# Patient Record
Sex: Female | Born: 1955 | Race: Black or African American | Hispanic: No | Smoking: Former smoker
Health system: Southern US, Community
[De-identification: ages and names within clinical notes are randomized; demographics above are authoritative.]

## PROBLEM LIST (undated history)

## (undated) DIAGNOSIS — F319 Bipolar disorder, unspecified: Secondary | ICD-10-CM

## (undated) DIAGNOSIS — I639 Cerebral infarction, unspecified: Secondary | ICD-10-CM

## (undated) DIAGNOSIS — I10 Essential (primary) hypertension: Secondary | ICD-10-CM

## (undated) HISTORY — PX: TUBAL LIGATION: SHX77

## (undated) HISTORY — PX: CAROTID STENT: SHX1301

---

## 2003-06-20 ENCOUNTER — Inpatient Hospital Stay (HOSPITAL_COMMUNITY): Admission: EM | Admit: 2003-06-20 | Discharge: 2003-06-28 | Payer: Self-pay | Admitting: Psychiatry

## 2014-01-31 ENCOUNTER — Emergency Department (HOSPITAL_COMMUNITY)
Admission: EM | Admit: 2014-01-31 | Discharge: 2014-02-01 | Disposition: A | Discharge status: Home / Self Care | Attending: Emergency Medicine | Admitting: Emergency Medicine

## 2014-01-31 ENCOUNTER — Emergency Department (HOSPITAL_COMMUNITY)

## 2014-01-31 ENCOUNTER — Encounter (HOSPITAL_COMMUNITY): Payer: Self-pay | Admitting: Emergency Medicine

## 2014-01-31 DIAGNOSIS — Z87891 Personal history of nicotine dependence: Secondary | ICD-10-CM | POA: Insufficient documentation

## 2014-01-31 DIAGNOSIS — F32A Depression, unspecified: Secondary | ICD-10-CM

## 2014-01-31 DIAGNOSIS — R4189 Other symptoms and signs involving cognitive functions and awareness: Secondary | ICD-10-CM

## 2014-01-31 DIAGNOSIS — R410 Disorientation, unspecified: Secondary | ICD-10-CM

## 2014-01-31 DIAGNOSIS — Z79899 Other long term (current) drug therapy: Secondary | ICD-10-CM | POA: Insufficient documentation

## 2014-01-31 DIAGNOSIS — F39 Unspecified mood [affective] disorder: Secondary | ICD-10-CM

## 2014-01-31 DIAGNOSIS — F329 Major depressive disorder, single episode, unspecified: Secondary | ICD-10-CM

## 2014-01-31 DIAGNOSIS — F319 Bipolar disorder, unspecified: Secondary | ICD-10-CM | POA: Insufficient documentation

## 2014-01-31 DIAGNOSIS — I10 Essential (primary) hypertension: Secondary | ICD-10-CM | POA: Insufficient documentation

## 2014-01-31 DIAGNOSIS — R4182 Altered mental status, unspecified: Secondary | ICD-10-CM | POA: Insufficient documentation

## 2014-01-31 HISTORY — DX: Essential (primary) hypertension: I10

## 2014-01-31 HISTORY — DX: Bipolar disorder, unspecified: F31.9

## 2014-01-31 HISTORY — DX: Cerebral infarction, unspecified: I63.9

## 2014-01-31 LAB — CBC
HEMATOCRIT: 38.1 % (ref 36.0–46.0)
Hemoglobin: 13 g/dL (ref 12.0–15.0)
MCH: 29 pg (ref 26.0–34.0)
MCHC: 34.1 g/dL (ref 30.0–36.0)
MCV: 84.9 fL (ref 78.0–100.0)
Platelets: 250 10*3/uL (ref 150–400)
RBC: 4.49 MIL/uL (ref 3.87–5.11)
RDW: 12.2 % (ref 11.5–15.5)
WBC: 7 10*3/uL (ref 4.0–10.5)

## 2014-01-31 LAB — RAPID URINE DRUG SCREEN, HOSP PERFORMED
Amphetamines: NOT DETECTED
Barbiturates: NOT DETECTED
Benzodiazepines: NOT DETECTED
COCAINE: NOT DETECTED
OPIATES: NOT DETECTED
Tetrahydrocannabinol: NOT DETECTED

## 2014-01-31 LAB — COMPREHENSIVE METABOLIC PANEL
ALBUMIN: 4.3 g/dL (ref 3.5–5.2)
ALT: 11 U/L (ref 0–35)
ANION GAP: 14 (ref 5–15)
AST: 17 U/L (ref 0–37)
Alkaline Phosphatase: 88 U/L (ref 39–117)
BUN: 11 mg/dL (ref 6–23)
CALCIUM: 10 mg/dL (ref 8.4–10.5)
CO2: 24 mEq/L (ref 19–32)
CREATININE: 0.87 mg/dL (ref 0.50–1.10)
Chloride: 104 mEq/L (ref 96–112)
GFR calc Af Amer: 85 mL/min — ABNORMAL LOW (ref >90)
GFR calc non Af Amer: 73 mL/min — ABNORMAL LOW (ref >90)
Glucose, Bld: 92 mg/dL (ref 70–99)
Potassium: 4.2 mEq/L (ref 3.7–5.3)
Sodium: 142 mEq/L (ref 137–147)
TOTAL PROTEIN: 8.8 g/dL — AB (ref 6.0–8.3)
Total Bilirubin: 1.2 mg/dL (ref 0.3–1.2)

## 2014-01-31 LAB — SALICYLATE LEVEL

## 2014-01-31 LAB — ETHANOL: Alcohol, Ethyl (B): <11 mg/dL (ref 0–11)

## 2014-01-31 LAB — ACETAMINOPHEN LEVEL: Acetaminophen (Tylenol), Serum: <15 ug/mL (ref 10–30)

## 2014-01-31 MED ORDER — ONDANSETRON HCL 4 MG PO TABS: 4.0000 mg | ORAL_TABLET | Freq: Three times a day (TID) | ORAL | Status: DC | PRN

## 2014-01-31 MED ORDER — AMLODIPINE BESYLATE 10 MG PO TABS
10.0000 mg | ORAL_TABLET | Freq: Every day | ORAL | Status: DC
  Administered 2014-02-01: 10 mg via ORAL
  Filled 2014-01-31: qty 1

## 2014-01-31 MED ORDER — ACETAMINOPHEN 325 MG PO TABS
650.0000 mg | ORAL_TABLET | ORAL | Status: DC | PRN
  Administered 2014-01-31 – 2014-02-01 (×2): 650 mg via ORAL
  Filled 2014-01-31 (×2): qty 2

## 2014-01-31 MED ORDER — IBUPROFEN 200 MG PO TABS
600.0000 mg | ORAL_TABLET | Freq: Three times a day (TID) | ORAL | Status: DC | PRN
  Filled 2014-01-31: qty 3

## 2014-01-31 MED ORDER — ZOLPIDEM TARTRATE 5 MG PO TABS
5.0000 mg | ORAL_TABLET | Freq: Every evening | ORAL | Status: DC | PRN
  Administered 2014-02-01: 5 mg via ORAL
  Filled 2014-01-31: qty 1

## 2014-01-31 MED ORDER — ALUM & MAG HYDROXIDE-SIMETH 200-200-20 MG/5ML PO SUSP: 30.0000 mL | ORAL | Status: DC | PRN

## 2014-01-31 MED ORDER — METOPROLOL SUCCINATE 12.5 MG HALF TABLET
12.5000 mg | ORAL_TABLET | Freq: Two times a day (BID) | ORAL | Status: DC
  Administered 2014-02-01: 12.5 mg via ORAL
  Filled 2014-01-31 (×2): qty 1

## 2014-01-31 MED ORDER — LISINOPRIL 40 MG PO TABS
40.0000 mg | ORAL_TABLET | Freq: Every day | ORAL | Status: DC
  Administered 2014-02-01: 40 mg via ORAL
  Filled 2014-01-31: qty 1

## 2014-01-31 NOTE — ED Notes (Signed)
Did call [facility] to confirm patients Geodon prescriptions 60mg  capsules; take 3 capsules every evening; last time filled was in April of this year

## 2014-01-31 NOTE — ED Notes (Signed)
TTS at bedside for psych eval

## 2014-01-31 NOTE — ED Notes (Signed)
Daughter at bedside.

## 2014-01-31 NOTE — ED Notes (Signed)
Daughter requests CT scan to rule out stroke or TIA; MD made aware; orders have been placed

## 2014-01-31 NOTE — ED Notes (Addendum)
Pt being moved to room 6; awaiting CT scan; all belongings sent with patient

## 2014-01-31 NOTE — BH Assessment (Signed)
Assessment Note  Erica Kelley is an 58 y.o. female who presents with her daughter for evaluation of declining mental status.  Her daughter reports that her mother had a major stroke in 2004 and that last year they discovered that she had some additional mini strokes, but through her life she has been treated for Bipolar Disorder.  Erica Heumann' daughter recently had her mother evaluated by court order because she is seeking guardianship due to her mother's decreasing ability to care for herself.  She reports she has to remind her mother to take care of her activities of daily living and that her mother frequently slips into states where she cannot function because she seems so fearful and childlike.  Erica Kelley did not participate in the assessment at all, but instead turned to look to her daughter for reassurance.  She barely was able to answer yes, when this writer asked if she lived with her daughter.  However, her daughter reports that this is not how she is most of the time at home.  She reports that her mother had a long relationship with a psychiatrist in [place], but they stopped taking her insurance so she had been going to [facility] in [place] for the last year.  However, her daughter reports everytime she went and told the PA that her mother was not acting normally, the PA increased her Geodon.  She is afraid that the Geodon is causing her mother's decline and reports that when she was evaluated for competency at [facility], the evaluator told her that previous stroke victims should not take Geodon at all.  She is afraid to return to the practice because she does not trust their ability to care for her mother safely.  She states she came to the ED for medicine evaluation and is hoping that she can get her mother's medications straightened out.  THe patient will be referred for Geropsych placement  Axis I: Bipolar, Depressed and Psychotic Disorder NOS Axis II: Deferred Axis III:  Past  Medical History  Diagnosis Date  . Bipolar 1 disorder   . Hypertension   . Stroke   . Manic depression    Axis IV: problems with access to health care services Axis V: 21-30 behavior considerably influenced by delusions or hallucinations OR serious impairment in judgment, communication OR inability to function in almost all areas  Past Medical History:  Past Medical History  Diagnosis Date  . Bipolar 1 disorder   . Hypertension   . Stroke   . Manic depression     Past Surgical History  Procedure Laterality Date  . Carotid stent    . Cesarean section    . Tubal ligation      Family History: History reviewed. No pertinent family history.  Social History:  reports that she has quit smoking. She has never used smokeless tobacco. She reports that she does not drink alcohol or use illicit drugs.  Additional Social History:  Alcohol / Drug Use History of alcohol / drug use?: No history of alcohol / drug abuse  CIWA: CIWA-Ar BP: 125/77 mmHg Pulse Rate: 66 COWS:    Allergies:  Allergies  Allergen Reactions  . Shellfish Allergy Swelling    Home Medications:  (Not in a hospital admission)  OB/GYN Status:  No LMP recorded. Patient is postmenopausal.  General Assessment Data Location of Assessment: [facility] Is this a Tele or Face-to-Face Assessment?: Face-to-Face Is this an Initial Assessment or a Re-assessment for this encounter?: Initial Assessment  Living Arrangements: Children Can pt return to current living arrangement?: Yes Admission Status: Voluntary Is patient capable of signing voluntary admission?: Yes Transfer from: Acute Hospital Referral Source: Self/Family/Friend     [facility] Living Arrangements: Children Name of Psychiatrist: Was seeing regional psychiatrist in [place]  Education Status Is patient currently in school?: Yes  Risk to self Suicidal Ideation: No Suicidal Intent: No Is patient at risk for suicide?: No Suicidal Plan?:  No Access to Means: No Previous Attempts/Gestures: No Intentional Self Injurious Behavior: None Family Suicide History: No Recent stressful life event(s): Recent negative physical changes Persecutory voices/beliefs?: No Depression: Yes Depression Symptoms: Tearfulness Substance abuse history and/or treatment for substance abuse?: No Suicide prevention information given to non-admitted patients: Not applicable  Risk to Others Homicidal Ideation: No Thoughts of Harm to Others: No Current Homicidal Intent: No Current Homicidal Plan: No Access to Homicidal Means: No History of harm to others?: No Assessment of Violence: None Noted Does patient have access to weapons?: No Criminal Charges Pending?: No Does patient have a court date: No  Psychosis Hallucinations: None noted Delusions:  (possible)  Mental Status Report Appear/Hygiene: Disheveled Eye Contact: Poor Motor Activity: Psychomotor retardation;Rigidity Speech: Elective mutism;Slurred;Slow Level of Consciousness: Quiet/awake Mood: Depressed;Helpless Affect: Fearful Anxiety Level: Moderate Thought Processes: Thought Blocking Judgement: Unable to Assess Orientation: Unable to assess Obsessive Compulsive Thoughts/Behaviors: Unable to Assess  Cognitive Functioning Concentration: Unable to Assess Memory: Unable to Assess IQ: Average Insight: Unable to Assess Impulse Control: Unable to Assess Appetite: Fair Sleep: Unable to Assess Vegetative Symptoms: Unable to Assess  ADLScreening [facility] Assessment Services) Patient's cognitive ability adequate to safely complete daily activities?: No Patient able to express need for assistance with ADLs?: No Independently performs ADLs?: No  Prior Inpatient Therapy Prior Inpatient Therapy: Yes Prior Therapy Dates: 2004 [facility] Reason for Treatment: Bipolar  Prior Outpatient Therapy Prior Outpatient Therapy: Yes Prior Therapy Dates:  ongoing Prior Therapy Facilty/Provider(s[place] [facility] [place] Reason for Treatment: Bipolar  ADL Screening (condition at time of admission) Patient's cognitive ability adequate to safely complete daily activities?: No Patient able to express need for assistance with ADLs?: No Independently performs ADLs?: No       Abuse/Neglect Assessment (Assessment to be complete while patient is alone) Physical Abuse: Denies Verbal Abuse: Denies Sexual Abuse: Denies Values / Beliefs Cultural Requests During Hospitalization: None Spiritual Requests During Hospitalization: None     Nutrition Screen- [facility] Adult/[facility] Patient's home diet: Regular  Additional Information 1:1 In Past 12 Months?: No CIRT Risk: No Elopement Risk: No Does patient have medical clearance?: Yes     Disposition:  Disposition Initial Assessment Completed for this Encounter: Yes Disposition of Patient: Inpatient treatment program Type of inpatient treatment program: Adult  On Site Evaluation by:   Reviewed with Physician:    Steward RosDavee Lomax, Milia Warth Marie 01/31/2014 6:15 PM

## 2014-01-31 NOTE — ED Notes (Addendum)
Pt's daughter would like Pt to have a psychiatric evaluation.  Reports that Pt takes Geodon for Bi-Polar disorder.  Pt will not answer questions and daughter sts that Pt is increasingly agitated, anxious, and scared.  Sts these episodes are intermittent.  [facility] and has been seen numerous time for same complaint.  Pt has, also, been [facility].  Hx of stroke.  Pt's daughter is working to become Pt's guardian.

## 2014-01-31 NOTE — ED Provider Notes (Signed)
CSN: [id number]     Arrival date & time 01/31/14  1214 History  This chart was scribed for non-physician practitioner Fayrene Helper, working with Toy Baker, MD by Carl Best, ED Scribe. This patient was seen in room [facility]3/[facility] and the patient's care was started at 1:40 PM.    Chief Complaint  Patient presents with  . Psychiatric Evaluation    No language interpreter was used.   HPI Comments: Erica Kelley is a 58 y.o. female with a history of Bipolar 1 disorder who presents to the Emergency Department for psychiatric evaluation in order to become the patient's guardian.  The patient's daughter states that there has been a steady decline of mental faculty over the past two years.  The patient's daughter states that she had a full psychiatric evaluation last week at [facility] per court order.  The patient's daughter states that she is waiting for the court date on September 10 to give her the evaluation so she can become the patient's guardian.  The patient's daughter states that the patient used to see a psychiatrist that increased her Geodon dosage to 180mg .  She states that the patient no longer sees that psychiatrist because she does not trust them and believes the increase in her medication dosage is responsible for her mental decline.  The patient's daughter states that the patient does not exhibit HI.  She states that the patient seems to be scared to move or say anything.  The patient states that she has noticed a decline in the patient's appetite over the past 2 months.  She states that she has to instruct her to bathe and take her medication.  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     Past Medical History  Diagnosis Date  . Bipolar 1 disorder   . Hypertension   . Stroke   . Manic depression    Past Surgical History  Procedure Laterality Date  . Carotid stent    . Cesarean section    . Tubal ligation     History reviewed. No  pertinent family history. History  Substance Use Topics  . Smoking status: Former Games developer  . Smokeless tobacco: Never Used  . Alcohol Use: No   OB History   Grav Para Term Preterm Abortions TAB SAB Ect Mult Living                 Review of Systems  Unable to perform ROS: Psychiatric disorder      Allergies  Shellfish allergy  Home Medications   Prior to Admission medications   Medication Sig Start Date End Date Taking? Authorizing Provider  acetaminophen (TYLENOL) 500 MG tablet Take 1,000 mg by mouth every 6 (six) hours as needed for moderate pain.   Yes Historical Provider, MD  ALOE VERA PO Take 1 capsule by mouth every Monday, Wednesday, and Friday.   Yes Historical Provider, MD  amLODipine (NORVASC) 10 MG tablet Take 10 mg by mouth daily.   Yes Historical Provider, MD  lisinopril (PRINIVIL,ZESTRIL) 40 MG tablet Take 40 mg by mouth daily.   Yes Historical Provider, MD  metoprolol succinate (TOPROL-XL) 25 MG 24 hr tablet Take 12.5 mg by mouth 2 (two) times daily.   Yes Historical Provider, MD  ziprasidone (GEODON) 60 MG capsule Take 60 mg by mouth at bedtime.   Yes Historical Provider, MD  zolpidem (AMBIEN) 10 MG tablet Take 10 mg by mouth at bedtime.  Yes Historical Provider, MD   Triage Vitals: BP 125/77  Pulse 66  Temp(Src) 98.5 F (36.9 C) (Oral)  Resp 16  SpO2 100%  Physical Exam  Nursing note and vitals reviewed. Constitutional: She is oriented to person, place, and time. She appears well-developed and well-nourished.  HENT:  Head: Normocephalic and atraumatic.  Mouth/Throat: Oropharynx is clear and moist.  Eyes: EOM are normal.  Neck: Normal range of motion.  Cardiovascular: Normal rate, regular rhythm and normal heart sounds.   Pulmonary/Chest: Effort normal.  Musculoskeletal: Normal range of motion.  Neurological: She is alert and oriented to person, place, and time.  Skin: Skin is warm and dry.  Psychiatric: She is noncommunicative.  Following  directions appropriately but does not verbally communicate.  Has good eye contact.    ED Course  Procedures (including critical care time)  DIAGNOSTIC STUDIES: Oxygen Saturation is 100% on room air, normal by my interpretation.    COORDINATION OF CARE: 1:46 PM- Advised the patient that in order to talk to a specialist, it will take a couple of hours.  Discussed the possibility of having TTS come and evaluate the patient.  The patient's daughter agreed  6:26 PM Pt's daughter worries of pt's mental changes, and afraid she is taking too much geodon which was prescribed by her psychiatrist.  Steady mental decline ongoing for the past several months.  Request another psychiastrist to evaluate her mental changes and determine the appropriate medication needed for her.  She is medically cleared, doubt delirium as a cause.  TTS to evaluate pt.  Hx limited as pt is non communicative.    6:49 PM TTS agrees to evaluate pt but request EKG and CXR.    Labs Review Labs Reviewed  COMPREHENSIVE METABOLIC PANEL - Abnormal; Notable for the following:    Total Protein 8.8 (*)    GFR calc non Af Amer 73 (*)    GFR calc Af Amer 85 (*)    All other components within normal limits  SALICYLATE LEVEL - Abnormal; Notable for the following:    Salicylate Lvl <2.0 (*)    All other components within normal limits  ACETAMINOPHEN LEVEL  CBC  ETHANOL  URINE RAPID DRUG SCREEN (HOSP PERFORMED)    Imaging Review No results found.   EKG Interpretation None      MDM   Final diagnoses:  Mood disorder  Depression    BP 125/77  Pulse 66  Temp(Src) 98.5 F (36.9 C) (Oral)  Resp 16  SpO2 100%  I personally performed the services described in this documentation, which was scribed in my presence. The recorded information has been reviewed and is accurate.     Fayrene HelperBowie Xzaria Teo, PA-C 01/31/14 1828  Fayrene HelperBowie Tulip Meharg, PA-C 02/03/14 [id number]

## 2014-01-31 NOTE — ED Notes (Signed)
Bed: WA06 Expected date:  Expected time:  Means of arrival:  Comments: HOLD-TCU

## 2014-01-31 NOTE — ED Provider Notes (Signed)
  Face-to-face evaluation   History: The patient was brought in by her daughter for psychiatric evaluation. It appears that patient's daughter, wants a second opinion. There is a legal proceeding currently regarding the patient's ability to care for self. She has a psychiatric disorder and is currently being treated by a psychiatrist currently. The patient is unable to contribute to history.  Physical exam: She is alert and responsive. She appears fearful. She answers questions with simple answers, such as yes or no. She appears depressed.   Assessment: Bipolar disorder. Patient has been medically stable for evaluation and treatment by the psychiatric service. I anticipate that she will see the psychiatrist, tomorrow morning. She does not require urgent treatment or restraints, at this time. She is voluntary.  Medical screening examination/treatment/procedure(s) were conducted as a shared visit with non-physician practitioner(s) and myself.  I personally evaluated the patient during the encounter  Flint MelterElliott L Claudina Oliphant, MD 01/31/14 2206

## 2014-01-31 NOTE — ED Notes (Signed)
Bed: WA30 Expected date:  Expected time:  Means of arrival:  Comments: Triage 3 

## 2014-01-31 NOTE — BH Assessment (Signed)
[facility] Assessment Progress Note    Pt referred to [facility] and [facility].  Both facilities have beds.

## 2014-02-01 ENCOUNTER — Encounter (HOSPITAL_COMMUNITY): Payer: Self-pay | Admitting: Registered Nurse

## 2014-02-01 DIAGNOSIS — R4189 Other symptoms and signs involving cognitive functions and awareness: Secondary | ICD-10-CM | POA: Diagnosis present

## 2014-02-01 DIAGNOSIS — F29 Unspecified psychosis not due to a substance or known physiological condition: Secondary | ICD-10-CM

## 2014-02-01 DIAGNOSIS — R410 Disorientation, unspecified: Secondary | ICD-10-CM | POA: Diagnosis present

## 2014-02-01 NOTE — BH Assessment (Signed)
[facility] Assessment Progress Note  D/c to [facility]--accepted by Dr. Zonia KiefStephens. Will sign voluntary paperwork and pt will sign in voluntarily and will fax paperwork. Pt should come through admitting department, tell them that she is coming to the geriatric behavioral health unit, and they will come down to get her.   Spoke with pt's dgtr and she will come and pick up pt and transport and sign pt in since she has temporary guardianship.  Shuvon Rankin, NP, agrees with disposition.

## 2014-02-01 NOTE — ED Notes (Signed)
Bedside report received from previous RN, Lynnsey. 

## 2014-02-01 NOTE — Discharge Instructions (Signed)
Altered Mental Status Altered mental status most often refers to an abnormal change in your responsiveness and awareness. It can affect your speech, thought, mobility, memory, attention span, or alertness. It can range from slight confusion to complete unresponsiveness (coma). Altered mental status can be a sign of a serious underlying medical condition. Rapid evaluation and medical treatment is necessary for patients having an altered mental status. CAUSES   Low blood sugar (hypoglycemia) or diabetes.  Severe loss of body fluids (dehydration) or a body salt (electrolyte) imbalance.  A stroke or other neurologic problem, such as dementia or delirium.  A head injury or tumor.  A drug or alcohol overdose.  Exposure to toxins or poisons.  Depression, anxiety, and stress.  A low oxygen level (hypoxia).  An infection.  Blood loss.  Twitching or shaking (seizure).  Heart problems, such as heart attack or heart rhythm problems (arrhythmias).  A body temperature that is too low or too high (hypothermia or hyperthermia). DIAGNOSIS  A diagnosis is based on your history, symptoms, physical and neurologic examinations, and diagnostic tests. Diagnostic tests may include:  Measurement of your blood pressure, pulse, breathing, and oxygen levels (vital signs).  Blood tests.  Urine tests.  X-ray exams.  A computerized magnetic scan (magnetic resonance imaging, MRI).  A computerized X-ray scan (computed tomography, CT scan). TREATMENT  Treatment will depend on the cause. Treatment may include:  Management of an underlying medical or mental health condition.  Critical care or support in the hospital. [place]   Only take over-the-counter or prescription medicines for pain, discomfort, or fever as directed by your caregiver.  Manage underlying conditions as directed by your caregiver.  Eat a healthy, well-balanced diet to maintain strength.  Join a support group or  prevention program to cope with the condition or trauma that caused the altered mental status. Ask your caregiver to help choose a program that works for you.  Follow up with your caregiver for further examination, therapy, or testing as directed. SEEK MEDICAL CARE IF:   You feel unwell or have chills.  You or your family notice a change in your behavior or your alertness.  You have trouble following your caregiver's treatment plan.  You have questions or concerns. SEEK IMMEDIATE MEDICAL CARE IF:   You have a rapid heartbeat or have chest pain.  You have difficulty breathing.  You have a fever.  You have a headache with a stiff neck.  You cough up blood.  You have blood in your urine or stool.  You have severe agitation or confusion. MAKE SURE YOU:   Understand these instructions.  Will watch your condition.  Will get help right away if you are not doing well or get worse. Document Released: 12/25/2009 Document Revised: 09/29/2011 Document Reviewed: 12/25/2009 [facility] Patient Information 2015 [place], [place]. This information is not intended to replace advice given to you by your health care provider. Make sure you discuss any questions you have with your health care provider.  Confusion Confusion is the inability to think with your usual speed or clarity. Confusion may come on quickly or slowly over time. How quickly the confusion comes on depends on the cause. Confusion can be due to any number of causes. CAUSES   Concussion, head injury, or head trauma.  Seizures.  Stroke.  Fever.  Senility.  Heightened emotional states like rage or terror.  Mental illness in which the person loses the ability to determine what is real and what is not (hallucinations).  Infections.  Toxic effects from alcohol, drugs, or prescription medicines.  Dehydration and an imbalance of salts in the body (electrolytes).  Lack of sleep.  Low blood sugar (diabetes).  Low levels  of oxygen (for example from chronic lung disorders).  Drug interactions or other medication side effects.  Nutritional deficiencies, especially niacin, thiamine, vitamin C, or vitamin B.  Sudden drop in body temperature (hypothermia).  Illness in the elderly. Constipation can result in confusion. An elderly person who is hospitalized may become confused due to change in daily routine. SYMPTOMS  People often describe their thinking as cloudy or unclear when they are confused. Confusion can also include feeling disoriented. That means you are unaware of where or who you are. You may also not know what the date or time is. If confused, you may also have difficulty paying attention, remembering and making decisions. Some people also act aggressively when they are confused.  DIAGNOSIS  The medical evaluation of confusion may include:  Blood and urine tests.  X-rays.  Brain and nervous system tests.  Analyzing your brain waves (electroencphalogram or EEG).  A special X-ray (MRI) of your head or other special studies. Your physician will ask questions such as:  Do you get days and nights mixed up?  Are you awake during regular sleep times?  Do you have trouble recognizing people?  Do you know where you are?  Do you know the date and time?  Does the confusion come and go?  Is the confusion quickly getting worse?  Has there been a recent illness?  Has there been a recent head injury?  Are you diabetic?  Do you have a lung disorder?  What medication are you taking?  Have you taken drugs or alcohol? TREATMENT  An admission to the hospital may not be needed, but a confused person should not be left alone. Stay with a family member or friend until the confusion clears. Avoid alcohol, pain relievers or sedative drugs until you have fully recovered. Do not drive until your caregiver says it is okay. HOME CARE INSTRUCTIONS What family and friends can do:  To find out if someone  is confused ask him or her their name, age, and the date. If the person is unsure or answers incorrectly, he or she is confused.  Always introduce yourself, no matter how well the person knows you.  Often remind the person of his or her location.  Place a calendar and clock near the confused person.  Talk about current events and plans for the day.  Try to keep the environment calm, quiet and peaceful.  Make sure the patient keeps follow up appointments with their physician. PREVENTION  Ways to prevent confusion:  Avoid alcohol.  Eat a balanced diet.  Get enough sleep.  Do not become isolated. Spend time with other people and make plans for your days.  Keep careful watch on your blood sugar levels if you are diabetic. SEEK IMMEDIATE MEDICAL CARE IF:   You develop severe headaches, repeated vomiting, seizures, blackouts or slurred speech.  There is increasing confusion, weakness, numbness, restlessness or personality changes.  You develop a loss of balance, have marked dizziness, feel uncoordinated or fall.  You have delusions, hallucinations or develop severe anxiety.  Your family members think you need to be rechecked. Document Released: 08/14/2004 Document Revised: 09/29/2011 Document Reviewed: 04/11/2008 [facility] Patient Information 2015 [place], [place]. This information is not intended to replace advice given to you by your health care provider. Make sure  you discuss any questions you have with your health care provider.

## 2014-02-01 NOTE — ED Notes (Signed)
Pt is ambulatory with steady gait.  Gets OOB every once in a while but easily redirected.

## 2014-02-01 NOTE — ED Notes (Signed)
Patient ate @ 25% of meal

## 2014-02-01 NOTE — ED Notes (Signed)
Pt and belongings wanded by security. Pt has one bag locked in locker #32 in TCU.

## 2014-02-01 NOTE — ED Notes (Signed)
Patient daughter called, states she has temporary guardianship at this time.

## 2014-02-01 NOTE — Consult Note (Signed)
[facility] Face-to-Face Psychiatry Consult   Reason for Consult:  Cognitive changes and confusion Referring Physician:  EDp  Erica Kelley is an 58 y.o. female. Total Time spent with patient: 45 minutes  Assessment: AXIS I:  Psychotic Disorder NOS AXIS II:  Deferred AXIS III:   Past Medical History  Diagnosis Date  . Bipolar 1 disorder   . Hypertension   . Stroke   . Manic depression    AXIS IV:  other psychosocial or environmental problems AXIS V:  21-30 behavior considerably influenced by delusions or hallucinations OR serious impairment in judgment, communication OR inability to function in almost all areas  Plan:  Recommend psychiatric Inpatient admission when medically cleared.  Subjective:   Erica Kelley is a 58 y.o. female patient.  HPI:  Patient brought to [facility] by daughter related to cognitive changes, confusion, decrease in daily activities, and medication management.  During interview patient was able to answer questions clearly.  Took patient sometime to respond (couple seconds) but answered most questions correctly.  Patient states that she has a problem sleeping.  Patient alsostates that she lives with her daughter and her 3 grandsons.  Patient denies suicidal/homicidal ideation, psychosis, and paranoia.  HPI Elements:   Location:  Confusion. Quality:  decrease in ADL's. Severity:  Cognitive changes. Timing:  several weeks.  Past Psychiatric History: Past Medical History  Diagnosis Date  . Bipolar 1 disorder   . Hypertension   . Stroke   . Manic depression     reports that she has quit smoking. She has never used smokeless tobacco. She reports that she does not drink alcohol or use illicit drugs. History reviewed. No pertinent family history. Family History Substance Abuse: No Family Supports: Yes, List: (daughter) Living Arrangements: Children Can pt return to current living arrangement?: Yes Abuse/Neglect [facility]) Physical Abuse: Denies Verbal Abuse:  Denies Sexual Abuse: Denies Allergies:   Allergies  Allergen Reactions  . Shellfish Allergy Swelling    ACT Assessment Complete:  Yes:    Educational Status    Risk to Self: Risk to self Suicidal Ideation: No Suicidal Intent: No Is patient at risk for suicide?: No Suicidal Plan?: No Access to Means: No Previous Attempts/Gestures: No Intentional Self Injurious Behavior: None Family Suicide History: No Recent stressful life event(s): Recent negative physical changes Persecutory voices/beliefs?: No Depression: Yes Depression Symptoms: Tearfulness Substance abuse history and/or treatment for substance abuse?: No Suicide prevention information given to non-admitted patients: Not applicable  Risk to Others: Risk to Others Homicidal Ideation: No Thoughts of Harm to Others: No Current Homicidal Intent: No Current Homicidal Plan: No Access to Homicidal Means: No History of harm to others?: No Assessment of Violence: None Noted Does patient have access to weapons?: No Criminal Charges Pending?: No Does patient have a court date: No  Abuse: Abuse/Neglect Assessment (Assessment to be complete while patient is alone) Physical Abuse: Denies Verbal Abuse: Denies Sexual Abuse: Denies  Prior Inpatient Therapy: Prior Inpatient Therapy Prior Inpatient Therapy: Yes Prior Therapy Dates: 2004 Prior Therapy Facilty/Provider(s): [facility] Reason for Treatment: Bipolar  Prior Outpatient Therapy: Prior Outpatient Therapy Prior Outpatient Therapy: Yes Prior Therapy Dates: ongoing Prior Therapy Facilty/Provider(s): [facility] [place] Reason for Treatment: Bipolar  Additional Information: Additional Information 1:1 In Past 12 Months?: No CIRT Risk: No Elopement Risk: No Does patient have medical clearance?: Yes                  Objective: Blood pressure 135/78, pulse 76, temperature 98.2  F (36.8 C), temperature source Oral, resp. rate 16, SpO2 98.00%.There  is no height or weight on file to calculate BMI. Results for orders placed during the hospital encounter of 01/31/14 (from the past 72 hour(s))  URINE RAPID DRUG SCREEN (HOSP PERFORMED)     Status: None   Collection Time    01/31/14  2:01 PM      Result Value Ref Range   Opiates NONE DETECTED  NONE DETECTED   Cocaine NONE DETECTED  NONE DETECTED   Benzodiazepines NONE DETECTED  NONE DETECTED   Amphetamines NONE DETECTED  NONE DETECTED   Tetrahydrocannabinol NONE DETECTED  NONE DETECTED   Barbiturates NONE DETECTED  NONE DETECTED   Comment:            DRUG SCREEN FOR MEDICAL PURPOSES     ONLY.  IF CONFIRMATION IS NEEDED     FOR ANY PURPOSE, NOTIFY LAB     WITHIN 5 DAYS.                LOWEST DETECTABLE LIMITS     FOR URINE DRUG SCREEN     Drug Class       Cutoff (ng/mL)     Amphetamine      1000     Barbiturate      200     Benzodiazepine   349     Tricyclics       179     Opiates          300     Cocaine          300     THC              50  ACETAMINOPHEN LEVEL     Status: None   Collection Time    01/31/14  2:04 PM      Result Value Ref Range   Acetaminophen (Tylenol), Serum <15.0  10 - 30 ug/mL   Comment:            THERAPEUTIC CONCENTRATIONS VARY     SIGNIFICANTLY. A RANGE OF 10-30     ug/mL MAY BE AN EFFECTIVE     CONCENTRATION FOR MANY PATIENTS.     HOWEVER, SOME ARE BEST TREATED     AT CONCENTRATIONS OUTSIDE THIS     RANGE.     ACETAMINOPHEN CONCENTRATIONS     >150 ug/mL AT 4 HOURS AFTER     INGESTION AND >50 ug/mL AT 12     HOURS AFTER INGESTION ARE     OFTEN ASSOCIATED WITH TOXIC     REACTIONS.  CBC     Status: None   Collection Time    01/31/14  2:04 PM      Result Value Ref Range   WBC 7.0  4.0 - 10.5 K/uL   RBC 4.49  3.87 - 5.11 MIL/uL   Hemoglobin 13.0  12.0 - 15.0 g/dL   HCT 38.1  36.0 - 46.0 %   MCV 84.9  78.0 - 100.0 fL   MCH 29.0  26.0 - 34.0 pg   MCHC 34.1  30.0 - 36.0 g/dL   RDW 12.2  11.5 - 15.5 %   Platelets 250  150 - 400 K/uL   COMPREHENSIVE METABOLIC PANEL     Status: Abnormal   Collection Time    01/31/14  2:04 PM      Result Value Ref Range   Sodium 142  137 - 147 mEq/L   Potassium 4.2  3.7 - 5.3 mEq/L   Chloride 104  96 - 112 mEq/L   CO2 24  19 - 32 mEq/L   Glucose, Bld 92  70 - 99 mg/dL   BUN 11  6 - 23 mg/dL   Creatinine, Ser 0.87  0.50 - 1.10 mg/dL   Calcium 10.0  8.4 - 10.5 mg/dL   Total Protein 8.8 (*) 6.0 - 8.3 g/dL   Albumin 4.3  3.5 - 5.2 g/dL   AST 17  0 - 37 U/L   ALT 11  0 - 35 U/L   Alkaline Phosphatase 88  39 - 117 U/L   Total Bilirubin 1.2  0.3 - 1.2 mg/dL   GFR calc non Af Amer 73 (*) >90 mL/min   GFR calc Af Amer 85 (*) >90 mL/min   Comment: (NOTE)     The eGFR has been calculated using the CKD EPI equation.     This calculation has not been validated in all clinical situations.     eGFR's persistently <90 mL/min signify possible Chronic Kidney     Disease.   Anion gap 14  5 - 15  ETHANOL     Status: None   Collection Time    01/31/14  2:04 PM      Result Value Ref Range   Alcohol, Ethyl (B) <11  0 - 11 mg/dL   Comment:            LOWEST DETECTABLE LIMIT FOR     SERUM ALCOHOL IS 11 mg/dL     FOR MEDICAL PURPOSES ONLY  SALICYLATE LEVEL     Status: Abnormal   Collection Time    01/31/14  2:04 PM      Result Value Ref Range   Salicylate Lvl <7.6 (*) 2.8 - 20.0 mg/dL   Labs are reviewed see above values.  Medications reviewed and no changes made  Current Facility-Administered Medications  Medication Dose Route Frequency Provider Last Rate Last Dose  . acetaminophen (TYLENOL) tablet 650 mg  650 mg Oral Q4H PRN Domenic Moras, PA-C   650 mg at 02/01/14 1402  . alum & mag hydroxide-simeth (MAALOX/MYLANTA) 200-200-20 MG/5ML suspension 30 mL  30 mL Oral PRN Domenic Moras, PA-C      . amLODipine (NORVASC) tablet 10 mg  10 mg Oral Daily Domenic Moras, PA-C   10 mg at 02/01/14 1110  . ibuprofen (ADVIL,MOTRIN) tablet 600 mg  600 mg Oral Q8H PRN Domenic Moras, PA-C      . lisinopril  (PRINIVIL,ZESTRIL) tablet 40 mg  40 mg Oral Daily Domenic Moras, PA-C   40 mg at 02/01/14 1110  . metoprolol succinate (TOPROL-XL) 24 hr tablet 12.5 mg  12.5 mg Oral BID Domenic Moras, PA-C   12.5 mg at 02/01/14 1111  . ondansetron (ZOFRAN) tablet 4 mg  4 mg Oral Q8H PRN Domenic Moras, PA-C      . zolpidem (AMBIEN) tablet 5 mg  5 mg Oral QHS PRN Domenic Moras, PA-C   5 mg at 02/01/14 0205   Current Outpatient Prescriptions  Medication Sig Dispense Refill  . acetaminophen (TYLENOL) 500 MG tablet Take 1,000 mg by mouth every 6 (six) hours as needed for moderate pain.      . ALOE VERA PO Take 1 capsule by mouth every Monday, Wednesday, and Friday.      Marland Kitchen amLODipine (NORVASC) 10 MG tablet Take 10 mg by mouth daily.      Marland Kitchen lisinopril (PRINIVIL,ZESTRIL) 40 MG tablet Take 40 mg by  mouth daily.      . metoprolol succinate (TOPROL-XL) 25 MG 24 hr tablet Take 12.5 mg by mouth 2 (two) times daily.      . ziprasidone (GEODON) 60 MG capsule Take 60 mg by mouth at bedtime.      Marland Kitchen zolpidem (AMBIEN) 10 MG tablet Take 10 mg by mouth at bedtime.        Psychiatric Specialty Exam:     Blood pressure 135/78, pulse 76, temperature 98.2 F (36.8 C), temperature source Oral, resp. rate 16, SpO2 98.00%.There is no height or weight on file to calculate BMI.  General Appearance: Casual  Eye Contact::  Good  Speech:  Clear and Coherent and Slow  Volume:  Normal  Mood:  Depressed  Affect:  Congruent  Thought Process:  Circumstantial  Orientation:  Full (Time, Place, and Person)  Thought Content:  "You'll need to ask my daughter some of those question"  Suicidal Thoughts:  No  Homicidal Thoughts:  No  Memory:  Immediate;   Fair Recent;   Fair Remote;   Fair  Judgement:  Impaired  Insight:  Present  Psychomotor Activity:  Decreased  Concentration:  Fair  Recall:  [place]: Fair  Akathisia:  No  Handed:  Right  AIMS (if indicated):     Assets:  Communication Skills Desire for  Improvement Housing  Sleep:      Musculoskeletal: Strength & Muscle Tone: within normal limits Gait & Station: Did not see patient ambulate.  Patient ableto move extribities Patient leans: N/A  Treatment Plan Summary: Daily contact with patient to assess and evaluate symptoms and progress in treatment Medication management  Inpatient treatment recommended. Patient accepted to [facility] for inpatient treatment.  Monitor for safety and stabilization until transfer.   Earleen Newport, FNP-BC 02/01/2014 4:10 PM  Patient is seen face to face for psychiatric evaluation along with physician extender, case discussed after clinical rounds and formulated appropriate treatment plan. Reviewed the information documented and agree with the treatment plan.  Pama Roskos,JANARDHAHA R. 02/02/2014 9:55 AM

## 2014-02-01 NOTE — ED Notes (Signed)
Patient belongings relocated to locker #[id number]

## 2014-02-05 NOTE — ED Provider Notes (Signed)
Medical screening examination/treatment/procedure(s) were performed by non-physician practitioner and as supervising physician I was immediately available for consultation/collaboration.  Florentina Marquart T Tajah Schreiner, MD 02/05/14 0804 

## 2014-07-18 ENCOUNTER — Emergency Department (HOSPITAL_BASED_OUTPATIENT_CLINIC_OR_DEPARTMENT_OTHER)
Admission: EM | Admit: 2014-07-18 | Discharge: 2014-07-18 | Disposition: A | Discharge status: Home / Self Care | Attending: Emergency Medicine | Admitting: Emergency Medicine

## 2014-07-18 ENCOUNTER — Encounter (HOSPITAL_BASED_OUTPATIENT_CLINIC_OR_DEPARTMENT_OTHER): Payer: Self-pay | Admitting: *Deleted

## 2014-07-18 ENCOUNTER — Emergency Department (HOSPITAL_BASED_OUTPATIENT_CLINIC_OR_DEPARTMENT_OTHER)

## 2014-07-18 DIAGNOSIS — I10 Essential (primary) hypertension: Secondary | ICD-10-CM | POA: Insufficient documentation

## 2014-07-18 DIAGNOSIS — F319 Bipolar disorder, unspecified: Secondary | ICD-10-CM | POA: Insufficient documentation

## 2014-07-18 DIAGNOSIS — Z87891 Personal history of nicotine dependence: Secondary | ICD-10-CM | POA: Diagnosis not present

## 2014-07-18 DIAGNOSIS — K529 Noninfective gastroenteritis and colitis, unspecified: Secondary | ICD-10-CM | POA: Diagnosis not present

## 2014-07-18 DIAGNOSIS — Z79899 Other long term (current) drug therapy: Secondary | ICD-10-CM | POA: Diagnosis not present

## 2014-07-18 DIAGNOSIS — R1013 Epigastric pain: Secondary | ICD-10-CM | POA: Diagnosis present

## 2014-07-18 DIAGNOSIS — Z7982 Long term (current) use of aspirin: Secondary | ICD-10-CM | POA: Diagnosis not present

## 2014-07-18 DIAGNOSIS — Z8673 Personal history of transient ischemic attack (TIA), and cerebral infarction without residual deficits: Secondary | ICD-10-CM | POA: Diagnosis not present

## 2014-07-18 DIAGNOSIS — R42 Dizziness and giddiness: Secondary | ICD-10-CM

## 2014-07-18 LAB — COMPREHENSIVE METABOLIC PANEL
ALBUMIN: 3.8 g/dL (ref 3.5–5.2)
ALK PHOS: 77 U/L (ref 39–117)
ALT: 28 U/L (ref 0–35)
AST: 25 U/L (ref 0–37)
Anion gap: 8 (ref 5–15)
BUN: 22 mg/dL (ref 6–23)
CO2: 24 mmol/L (ref 19–32)
Calcium: 9.2 mg/dL (ref 8.4–10.5)
Chloride: 109 mEq/L (ref 96–112)
Creatinine, Ser: 0.98 mg/dL (ref 0.50–1.10)
GFR calc Af Amer: 73 mL/min — ABNORMAL LOW (ref >90)
GFR calc non Af Amer: 63 mL/min — ABNORMAL LOW (ref >90)
GLUCOSE: 171 mg/dL — AB (ref 70–99)
POTASSIUM: 3.8 mmol/L (ref 3.5–5.1)
Sodium: 141 mmol/L (ref 135–145)
TOTAL PROTEIN: 7.5 g/dL (ref 6.0–8.3)
Total Bilirubin: 1.1 mg/dL (ref 0.3–1.2)

## 2014-07-18 LAB — URINE MICROSCOPIC-ADD ON

## 2014-07-18 LAB — URINALYSIS, ROUTINE W REFLEX MICROSCOPIC
Bilirubin Urine: NEGATIVE
GLUCOSE, UA: NEGATIVE mg/dL
Ketones, ur: NEGATIVE mg/dL
Nitrite: NEGATIVE
Protein, ur: NEGATIVE mg/dL
SPECIFIC GRAVITY, URINE: 1.035 — AB (ref 1.005–1.030)
Urobilinogen, UA: 1 mg/dL (ref 0.0–1.0)
pH: 5.5 (ref 5.0–8.0)

## 2014-07-18 LAB — CBC WITH DIFFERENTIAL/PLATELET
BASOS PCT: 0 % (ref 0–1)
Basophils Absolute: 0 10*3/uL (ref 0.0–0.1)
EOS ABS: 0.1 10*3/uL (ref 0.0–0.7)
Eosinophils Relative: 1 % (ref 0–5)
HCT: 38 % (ref 36.0–46.0)
Hemoglobin: 13 g/dL (ref 12.0–15.0)
LYMPHS ABS: 1.2 10*3/uL (ref 0.7–4.0)
Lymphocytes Relative: 9 % — ABNORMAL LOW (ref 12–46)
MCH: 28.5 pg (ref 26.0–34.0)
MCHC: 34.2 g/dL (ref 30.0–36.0)
MCV: 83.3 fL (ref 78.0–100.0)
Monocytes Absolute: 0.3 10*3/uL (ref 0.1–1.0)
Monocytes Relative: 3 % (ref 3–12)
NEUTROS PCT: 87 % — AB (ref 43–77)
Neutro Abs: 11.5 10*3/uL — ABNORMAL HIGH (ref 1.7–7.7)
PLATELETS: 235 10*3/uL (ref 150–400)
RBC: 4.56 MIL/uL (ref 3.87–5.11)
RDW: 13 % (ref 11.5–15.5)
WBC: 13.2 10*3/uL — ABNORMAL HIGH (ref 4.0–10.5)

## 2014-07-18 LAB — TROPONIN I: Troponin I: 0.03 ng/mL (ref <0.031)

## 2014-07-18 LAB — LIPASE, BLOOD: Lipase: 78 U/L — ABNORMAL HIGH (ref 11–59)

## 2014-07-18 MED ORDER — IOHEXOL 300 MG/ML  SOLN
100.0000 mL | Freq: Once | INTRAMUSCULAR | Status: AC | PRN
  Administered 2014-07-18: 100 mL via INTRAVENOUS

## 2014-07-18 MED ORDER — IOHEXOL 300 MG/ML  SOLN
25.0000 mL | Freq: Once | INTRAMUSCULAR | Status: AC | PRN
  Administered 2014-07-18: 25 mL via ORAL

## 2014-07-18 MED ORDER — SODIUM CHLORIDE 0.9 % IV BOLUS (SEPSIS)
500.0000 mL | Freq: Once | INTRAVENOUS | Status: AC
  Administered 2014-07-18: 500 mL via INTRAVENOUS

## 2014-07-18 MED ORDER — CIPROFLOXACIN HCL 500 MG PO TABS: 500.0000 mg | ORAL_TABLET | Freq: Two times a day (BID) | ORAL | Status: AC

## 2014-07-18 MED ORDER — METRONIDAZOLE 500 MG PO TABS: 500.0000 mg | ORAL_TABLET | Freq: Two times a day (BID) | ORAL | Status: AC

## 2014-07-18 MED ORDER — GI COCKTAIL ~~LOC~~
30.0000 mL | Freq: Once | ORAL | Status: AC
  Administered 2014-07-18: 30 mL via ORAL
  Filled 2014-07-18: qty 30

## 2014-07-18 NOTE — ED Provider Notes (Signed)
CSN: [id number]     Arrival date & time 07/18/14  [id number] History   First MD Initiated Contact with Patient 07/18/14 [phone]     Chief Complaint  Patient presents with  . Abdominal Pain  . Dizziness     (Consider location/radiation/quality/duration/timing/severity/associated sxs/prior Treatment) Patient is a 58 y.o. female presenting with abdominal pain and dizziness. The history is provided by the patient. No language interpreter was used.  Abdominal Pain Pain location:  Epigastric Pain quality: aching   Pain radiates to:  Does not radiate Pain severity:  Moderate Onset quality:  Sudden Duration:  12 hours Timing:  Intermittent Progression:  Waxing and waning Chronicity:  New Relieved by:  Nothing Worsened by:  Nothing tried Ineffective treatments:  None tried Associated symptoms: diarrhea (one episode in department)   Associated symptoms: no chest pain, no chills, no cough, no dysuria, no fatigue, no fever, no hematuria, no nausea, no shortness of breath, no sore throat and no vomiting   Associated symptoms comment:  Dizziness described as room spinning and one episode of near syncope while walking down her hall at home this morning Dizziness Quality:  Room spinning Severity:  Moderate Duration:  12 hours Timing:  Constant Progression:  Unchanged Context comment:  Not changed by mvmt Relieved by:  Nothing Worsened by:  Standing up Ineffective treatments:  None tried Associated symptoms: diarrhea (one episode in department)   Associated symptoms: no blood in stool, no chest pain, no headaches, no nausea, no palpitations, no shortness of breath, no vomiting and no weakness   Risk factors: hx of stroke     Past Medical History  Diagnosis Date  . Bipolar 1 disorder   . Hypertension   . Stroke   . Manic depression    Past Surgical History  Procedure Laterality Date  . Carotid stent    . Cesarean section    . Tubal ligation     No family history on file. History   Substance Use Topics  . Smoking status: Former Games developermoker  . Smokeless tobacco: Never Used  . Alcohol Use: No   OB History    No data available     Review of Systems  Constitutional: Negative for fever, chills, diaphoresis, activity change, appetite change and fatigue.  HENT: Negative for congestion, facial swelling, rhinorrhea and sore throat.   Eyes: Negative for photophobia and discharge.  Respiratory: Negative for cough, chest tightness and shortness of breath.   Cardiovascular: Negative for chest pain, palpitations and leg swelling.  Gastrointestinal: Positive for abdominal pain and diarrhea (one episode in department). Negative for nausea, vomiting and blood in stool.  Endocrine: Negative for polydipsia and polyuria.  Genitourinary: Negative for dysuria, frequency, hematuria, difficulty urinating and pelvic pain.  Musculoskeletal: Negative for back pain, arthralgias, neck pain and neck stiffness.  Skin: Negative for color change and wound.  Allergic/Immunologic: Negative for immunocompromised state.  Neurological: Positive for dizziness. Negative for facial asymmetry, weakness, numbness and headaches.  Hematological: Does not bruise/bleed easily.  Psychiatric/Behavioral: Negative for confusion and agitation.      Allergies  Shellfish allergy  Home Medications   Prior to Admission medications   Medication Sig Start Date End Date Taking? Authorizing Provider  ARIPiprazole (ABILIFY) 15 MG tablet Take 15 mg by mouth daily.   Yes Historical Provider, MD  aspirin 81 MG tablet Take 81 mg by mouth daily.   Yes Historical Provider, MD  atorvastatin (LIPITOR) 20 MG tablet Take 20 mg by mouth daily.   Yes Historical  Provider, MD  Cholecalciferol (VITAMIN D PO) Take by mouth once a week.   Yes Historical Provider, MD  donepezil (ARICEPT) 5 MG tablet Take 5 mg by mouth at bedtime.   Yes Historical Provider, MD  traZODone (DESYREL) 50 MG tablet Take 50 mg by mouth at bedtime as needed  for sleep.   Yes Historical Provider, MD  acetaminophen (TYLENOL) 500 MG tablet Take 1,000 mg by mouth every 6 (six) hours as needed for moderate pain.    Historical Provider, MD  ALOE VERA PO Take 1 capsule by mouth every Monday, Wednesday, and Friday.    Historical Provider, MD  amLODipine (NORVASC) 10 MG tablet Take 10 mg by mouth daily.    Historical Provider, MD  ciprofloxacin (CIPRO) 500 MG tablet Take 1 tablet (500 mg total) by mouth 2 (two) times daily. One po bid x 7 days 07/18/14   Toy Cookey, MD  lisinopril (PRINIVIL,ZESTRIL) 40 MG tablet Take 40 mg by mouth daily.    Historical Provider, MD  metoprolol succinate (TOPROL-XL) 25 MG 24 hr tablet Take 12.5 mg by mouth 2 (two) times daily.    Historical Provider, MD  metroNIDAZOLE (FLAGYL) 500 MG tablet Take 1 tablet (500 mg total) by mouth 2 (two) times daily. One po bid x 7 days 07/18/14   Toy Cookey, MD  ziprasidone (GEODON) 60 MG capsule Take 60 mg by mouth at bedtime.    Historical Provider, MD  zolpidem (AMBIEN) 10 MG tablet Take 10 mg by mouth at bedtime.    Historical Provider, MD   BP 136/85 mmHg  Pulse 68  Temp(Src) 98.7 F (37.1 C) (Oral)  Resp 20  Ht 5\' 3"  (1.6 m)  Wt 175 lb (79.379 kg)  BMI 31.01 kg/m2  SpO2 100% Physical Exam  Constitutional: She is oriented to person, place, and time. She appears well-developed and well-nourished. No distress.  HENT:  Head: Normocephalic and atraumatic.  Mouth/Throat: No oropharyngeal exudate.  Eyes: Pupils are equal, round, and reactive to light.  Neck: Normal range of motion. Neck supple.  Cardiovascular: Normal rate, regular rhythm and normal heart sounds.  Exam reveals no gallop and no friction rub.   No murmur heard. Pulmonary/Chest: Effort normal and breath sounds normal. No respiratory distress. She has no wheezes. She has no rales.  Abdominal: Soft. Bowel sounds are normal. She exhibits no distension and no mass. There is tenderness in the epigastric area. There is  no rebound and no guarding.  Musculoskeletal: Normal range of motion. She exhibits no edema or tenderness.  Neurological: She is alert and oriented to person, place, and time. She has normal strength. She displays no atrophy and no tremor. No cranial nerve deficit or sensory deficit. She exhibits normal muscle tone. Coordination and gait normal. GCS eye subscore is 4. GCS verbal subscore is 5. GCS motor subscore is 6.  Reports dizziness described as room spinning at rest & w/ mvmt that is constant, not worsened by activity. No nystagmus with head mvmt or dix-halpike testing.   Skin: Skin is warm and dry.  Psychiatric: She has a normal mood and affect.    ED Course  Procedures (including critical care time) Labs Review Labs Reviewed  CBC WITH DIFFERENTIAL - Abnormal; Notable for the following:    WBC 13.2 (*)    Neutrophils Relative % 87 (*)    Neutro Abs 11.5 (*)    Lymphocytes Relative 9 (*)    All other components within normal limits  COMPREHENSIVE METABOLIC PANEL -  Abnormal; Notable for the following:    Glucose, Bld 171 (*)    GFR calc non Af Amer 63 (*)    GFR calc Af Amer 73 (*)    All other components within normal limits  LIPASE, BLOOD - Abnormal; Notable for the following:    Lipase 78 (*)    All other components within normal limits  URINALYSIS, ROUTINE W REFLEX MICROSCOPIC - Abnormal; Notable for the following:    APPearance CLOUDY (*)    Specific Gravity, Urine 1.035 (*)    Hgb urine dipstick TRACE (*)    Leukocytes, UA MODERATE (*)    All other components within normal limits  URINE MICROSCOPIC-ADD ON - Abnormal; Notable for the following:    Squamous Epithelial / LPF FEW (*)    Bacteria, UA MANY (*)    All other components within normal limits  URINE CULTURE  TROPONIN I    Imaging Review Ct Head Wo Contrast  07/18/2014   CLINICAL DATA:  Dizziness.  EXAM: CT HEAD WITHOUT CONTRAST  TECHNIQUE: Contiguous axial images were obtained from the base of the skull  through the vertex without intravenous contrast.  COMPARISON:  CT scan of January 31, 2014.  FINDINGS: Bony calvarium appears intact. Mild chronic ischemic white matter disease is noted. Lacunar infarction is noted in left basal ganglia. No mass effect or midline shift is noted. Ventricular size is within normal limits. There is no evidence of mass lesion, hemorrhage or acute infarction.  IMPRESSION: Mild chronic ischemic white matter disease. Old lacunar infarction in left basal ganglia. No acute intracranial abnormality seen.   Electronically Signed   By: Roque Lias M.D.   On: 07/18/2014 08:31   Ct Abdomen Pelvis W Contrast  07/18/2014   CLINICAL DATA:  Epigastric abdominal pain, diarrhea, dizziness again hand last night.  EXAM: CT ABDOMEN AND PELVIS WITH CONTRAST  TECHNIQUE: Multidetector CT imaging of the abdomen and pelvis was performed using the standard protocol following bolus administration of intravenous contrast.  CONTRAST:  OMNIPAQUE IOHEXOL 300 MG/ML  SOLN  COMPARISON:  None.  FINDINGS: Minimal left base atelectasis. Right lung base is clear. Heart is normal size. No effusions.  Liver, gallbladder, spleen, pancreas, adrenals and right kidney are unremarkable. Cortical thinning/ scarring noted in the midpole of the left kidney. No stones or hydronephrosis.  There are abnormal small bowel loops noted in the mid and left abdomen and pelvis with wall thickening. This is compatible with infectious or inflammatory enteritis. No evidence of bowel obstruction. There is a small amount of free fluid in the pelvis and right paracolic gutter. There are scattered air-fluid levels in the colon. The rectosigmoid colon appears fluid-filled. Stomach is decompressed, grossly unremarkable.  Aortic and iliac calcifications without aneurysm. No free air or adenopathy.  No acute bony abnormality. Degenerative disc disease changes at L5-S1 with disc space narrowing and vacuum disc.  IMPRESSION: Small bowel wall  thickening and surrounding inflammation involving small bowel loops in the mid and left lower abdomen and pelvis most compatible with infectious or inflammatory enteritis. Small amount of associated free fluid in the abdomen and pelvis.   Electronically Signed   By: Charlett Nose M.D.   On: 07/18/2014 10:39     EKG Interpretation None      MDM   Final diagnoses:  Dizziness  Epigastric pain  Enteritis    Pt is a 58 y.o. female with Pmhx as above including CVA, who presents with intermittent epigastric abdominal pain since around  2200 last night.  She states episodes last proximal 7 seconds and are occurring every 20 minutes.  She also states that she has had dizziness described as room spinning and had a near syncopal episode while walking down the hall this morning.  Dizziness is constant and unchanged w/ mvmt. She had no loss of consciousness and slid down the wall onto the floor.  Patient had one episode of diarrhea in the department, though denies diarrhea.  Home.  She also denies nausea, vomiting, fevers, chills, dysuria. On PE, VSS, pt in NAD. +epigastric ttp w/o rebound or guarding. No focal neuro findings, reporting constant sensation of room spinning, no nystagmus w/ head mvmt or dix-halpike testing.   1:22 PMdizziness resolved after 1.5 L of normal saline.  Abdominal pain is also resolved after GI cocktail.  She is ambulated in Department without difficulty.  She is tolerated liquids in the department.  CT head with no acute findings. Lipase mildly elevated at 78, however, she has no LFT changes, no right upper quadrant tenderness, and on CT abdomen and pelvis.  Findings suggestive of infectious or inflammatory enteritis.  Patient has had one further episode of diarrhea in the department.  I feel this likely cause of symptoms.  Given symptom resolution with fluids and nonfocal neuro exam.  I doubt stroke.  Patient will be discharged home with course of Cipro/Flagyl.  She's been asked to  follow-up closely with her PCP    Alphonsa GinFrances M Condie evaluation in the Emergency Department is complete. It has been determined that no acute conditions requiring further emergency intervention are present at this time. The patient/guardian have been advised of the diagnosis and plan. We have discussed signs and symptoms that warrant return to the ED, such as changes or worsening in symptoms, worsening pain, fever, inability to tolerate liquids, numbness, weakness, return, or dizziness.      Toy CookeyMegan Docherty, MD 07/18/14 1322

## 2014-07-18 NOTE — ED Notes (Signed)
Ems reports patient has upper abdominal pain and dizziness since last night. This morning while walking down the hall, became dizzy and slid down the wall to the floor.  Spine assessment cleared.  CBG 156.

## 2014-07-18 NOTE — ED Notes (Signed)
Patient states she developed upper abdominal pain and dizziness last night around 2200.  States this morning she has the same symptoms and while walking down the hall, she had to slide down the wall to the floor.  Patient complains of intermittent epigastric abdominal pain and constant dizziness.  Upon arrival to the room, patient stated that she needed to have diarrhea; was incontinent of a small amount.

## 2014-07-18 NOTE — ED Notes (Signed)
Patient ambulated around the department with no complaints of dizziness.

## 2014-07-18 NOTE — Discharge Instructions (Signed)

## 2014-07-19 LAB — URINE CULTURE

## 2016-04-01 IMAGING — CT CT HEAD W/O CM
2 series · 15 of 30 positions shown, 19 images · non-contrast
Comparison: CT of the head February 27, 2013

CLINICAL DATA: Altered mental status, history of stroke.

EXAM:
CT HEAD WITHOUT CONTRAST
TECHNIQUE: Contiguous axial images were obtained from the base of the skull
through the vertex without intravenous contrast.

[Series 2: head w/o · axial · non-contrast · 0.40mm/px · z∈[-172,-47]mm · 13 of 31 slices shown, 17 images]
[im 3/31  brain]
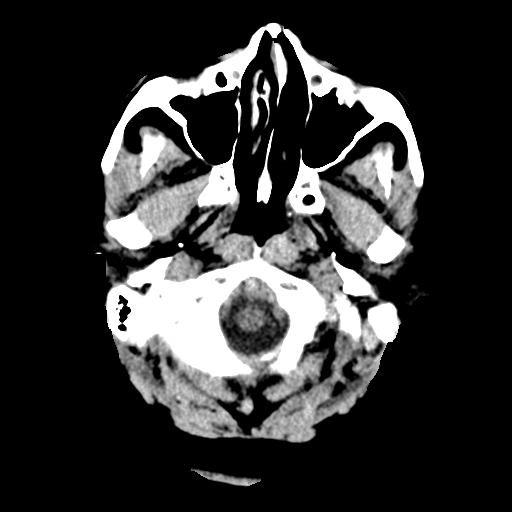
[im 3/31  bone]
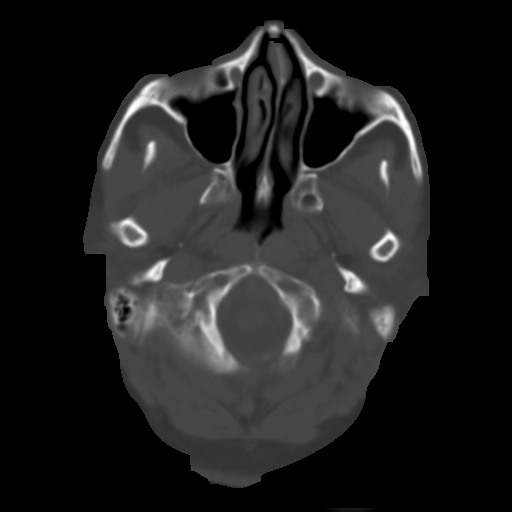
[im 5/31  brain]
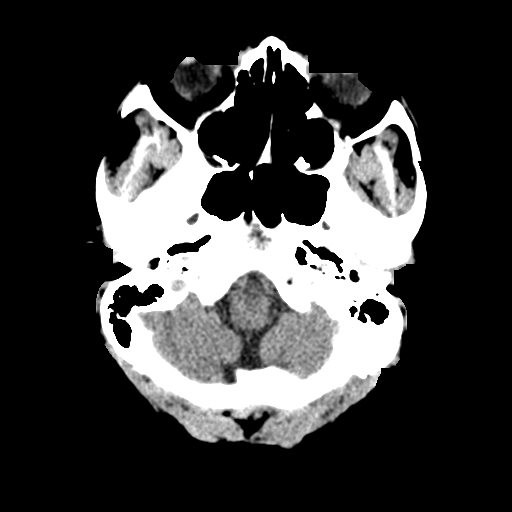
[im 7/31  brain]
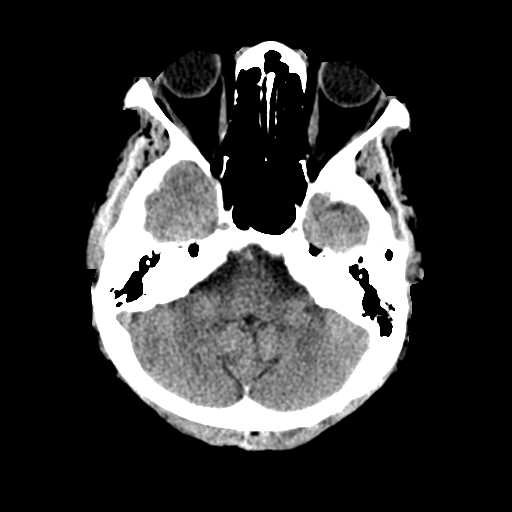
[im 9/31  brain]
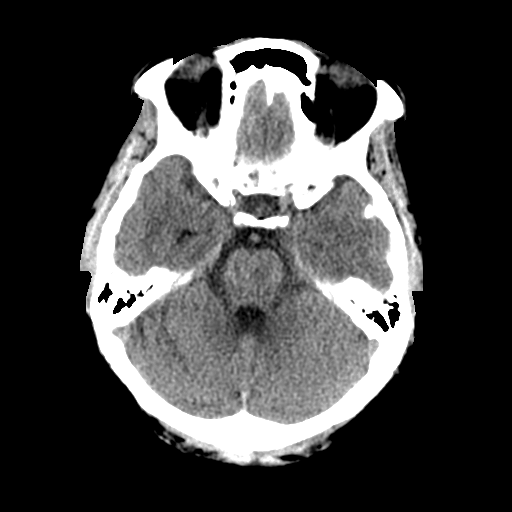
[im 11/31  brain]
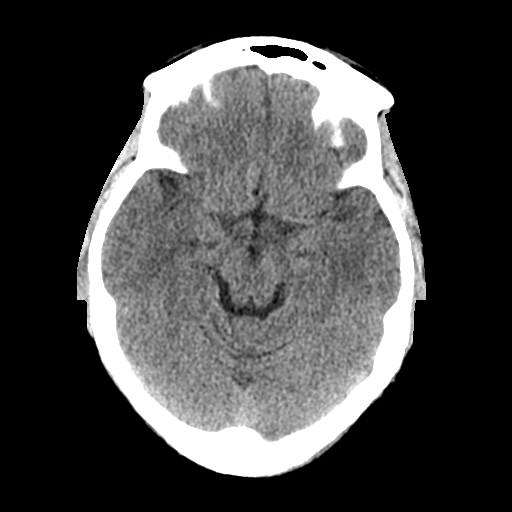
[im 11/31  bone]
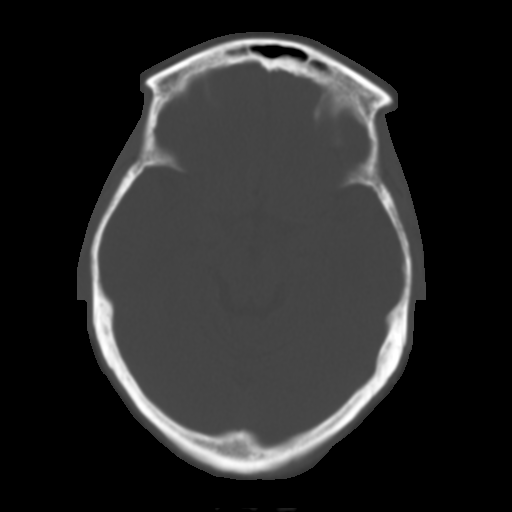
[im 13/31  brain]
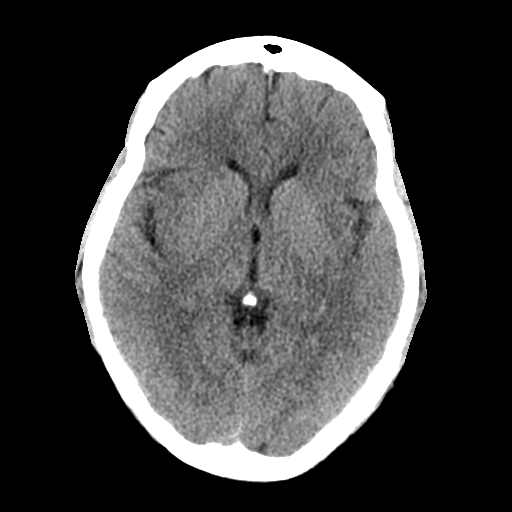
[im 16/31  brain]
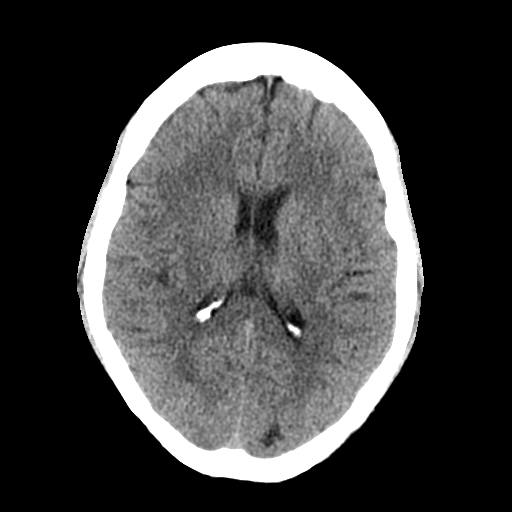
[im 18/31  brain]
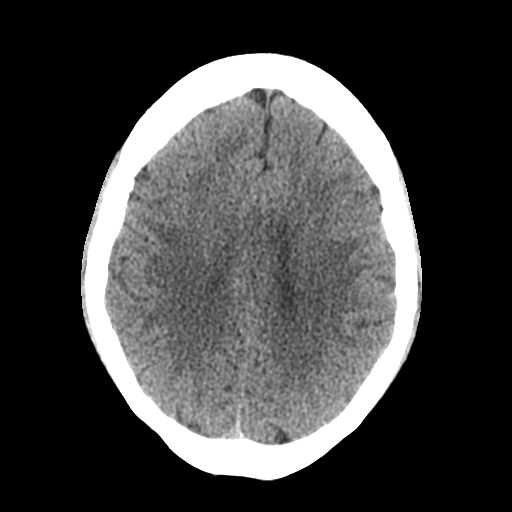
[im 20/31  brain]
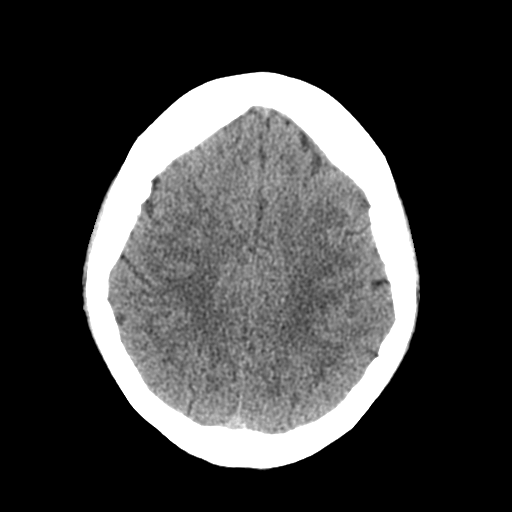
[im 20/31  bone]
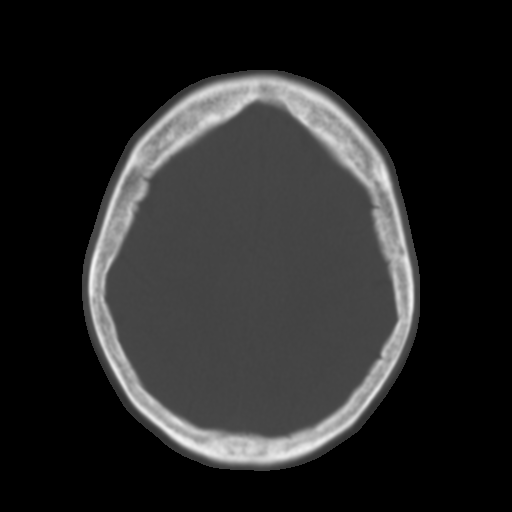
[im 22/31  brain]
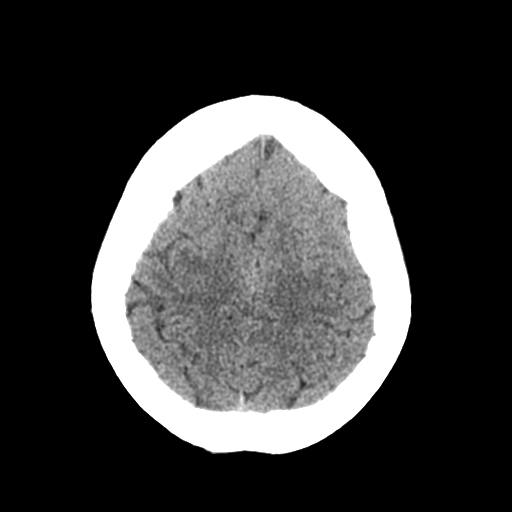
[im 24/31  brain]
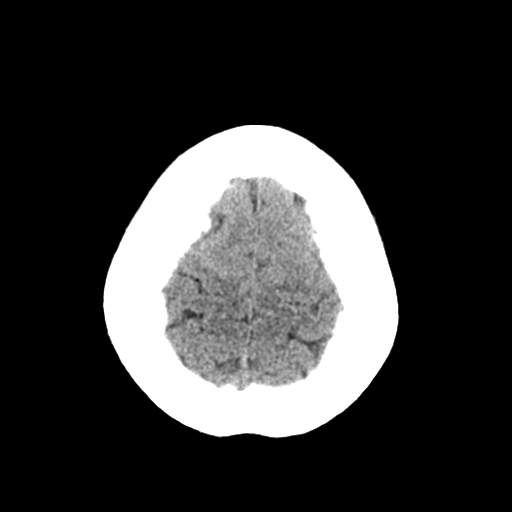
[im 26/31  brain]
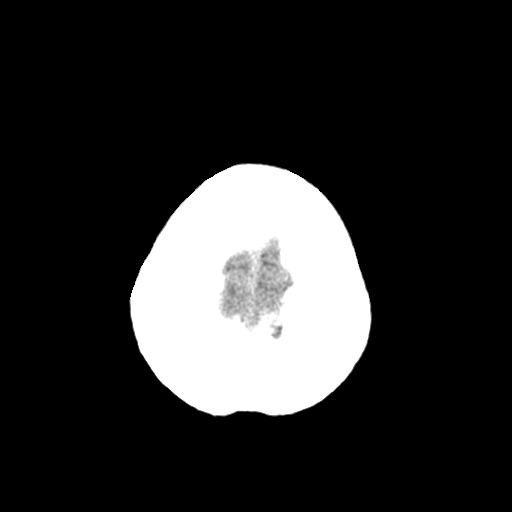
[im 28/31  brain]
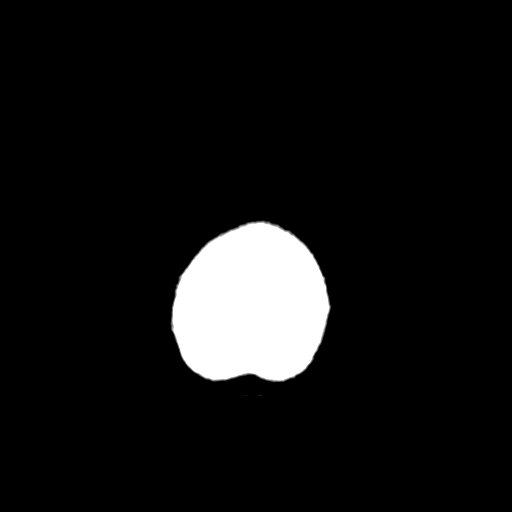
[im 28/31  bone]
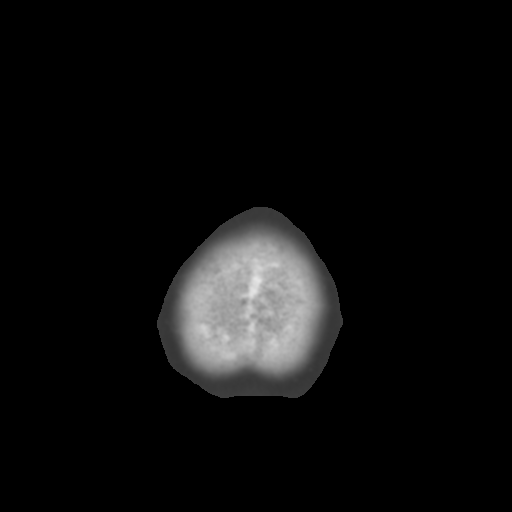

[Series 3: bone windows · axial · 0.40mm/px · z∈[-172,-152]mm · 2 of 31 slices shown]
[im 3/31  bone]
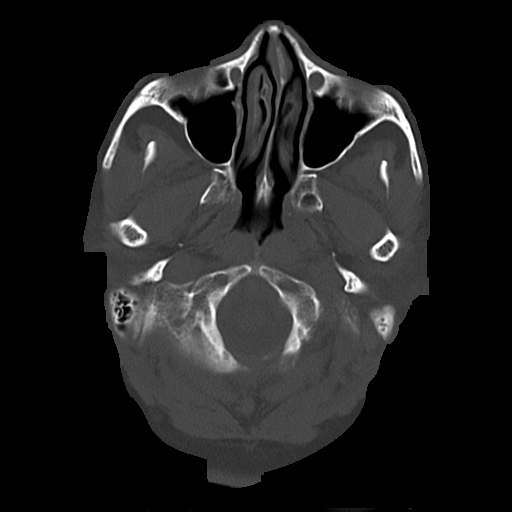
[im 7/31  bone]
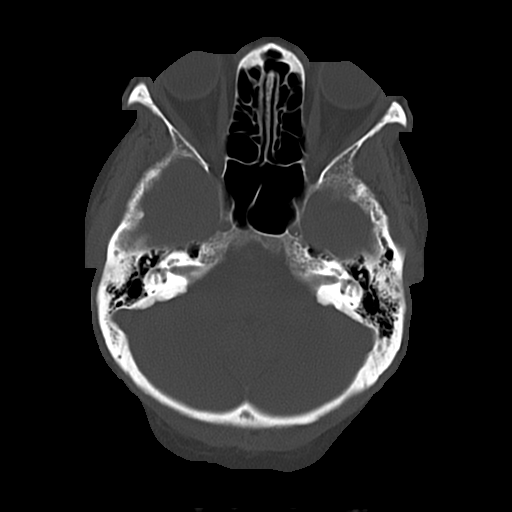

[15 of 30 positions shown; findings below may reference images not displayed]

FINDINGS: The ventricles and sulci are normal for age. No intraparenchymal
hemorrhage, mass effect nor midline shift. Minimal supratentorial
and pontine white matter hypodensities suggest sequelae of chronic
small vessel ischemic disease. No acute large vascular territory
infarcts. Left mesial occipital lobe transcortical encephalomalacia.

No abnormal extra-axial fluid collections. Basal cisterns are
patent. Mild calcific atherosclerosis of the carotid siphons.

No skull fracture. The included ocular globes and orbital contents
are non-suspicious. The mastoid aircells and included paranasal
sinuses are well-aerated.
IMPRESSION: No acute intracranial process.

Mild white matter changes suggest chronic small vessel ischemic
disease.

Remote small left posterior cerebral artery territory infarct.

  By: Dat Guy Raswiswi

## 2016-09-16 IMAGING — CT CT ABD-PELV W/ CM
2 of 5 series · 16 of 46 positions shown, 18 images · IV contrast (APPLIED)
Comparison: None.

CLINICAL DATA: Epigastric abdominal pain, diarrhea, dizziness again
hand last night.

EXAM:
CT ABDOMEN AND PELVIS WITH CONTRAST
TECHNIQUE: Multidetector CT imaging of the abdomen and pelvis was performed
using the standard protocol following bolus administration of
intravenous contrast.
CONTRAST:  100mL OMNIPAQUE IOHEXOL 300 MG/ML  SOLN

[Series 2: abd/pelvis 5.0 b31f · axial · 0.77mm/px · z∈[+862,+1282]mm · 13 of 94 slices shown, 15 images]
[im 5/94  soft-tissue]
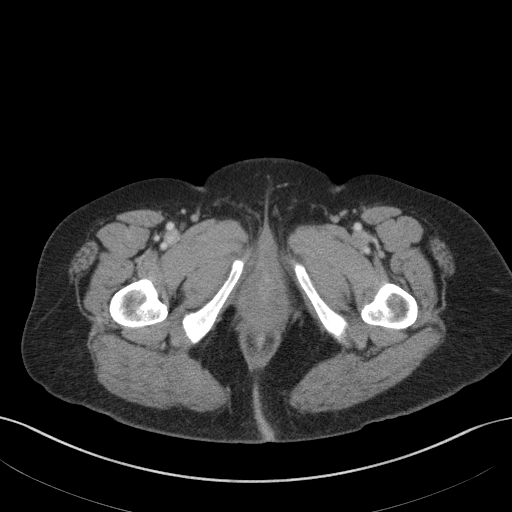
[im 5/94  bone]
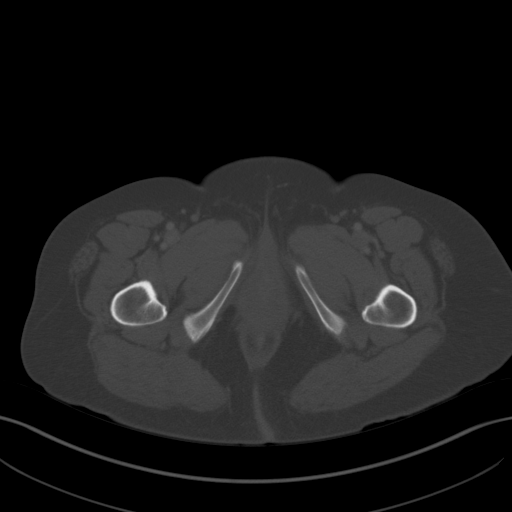
[im 15/94  soft-tissue]
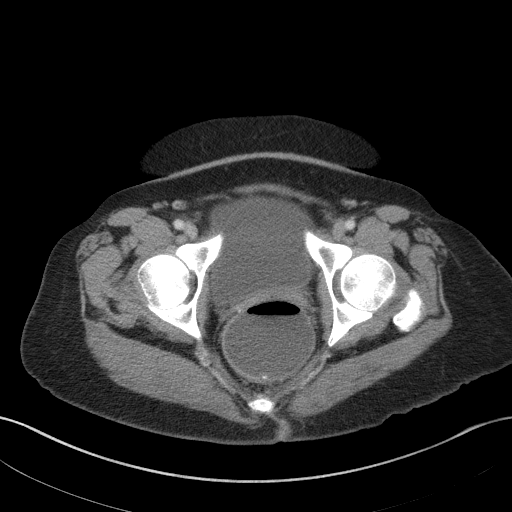
[im 20/94  soft-tissue]
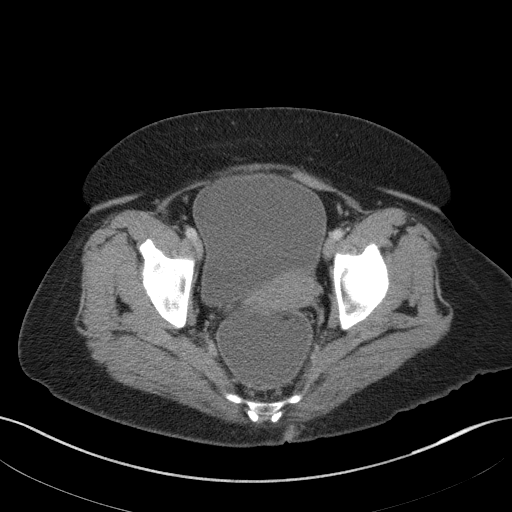
[im 25/94  soft-tissue]
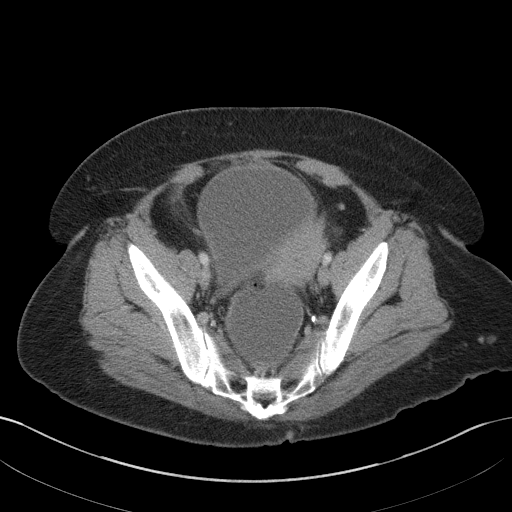
[im 35/94  soft-tissue]
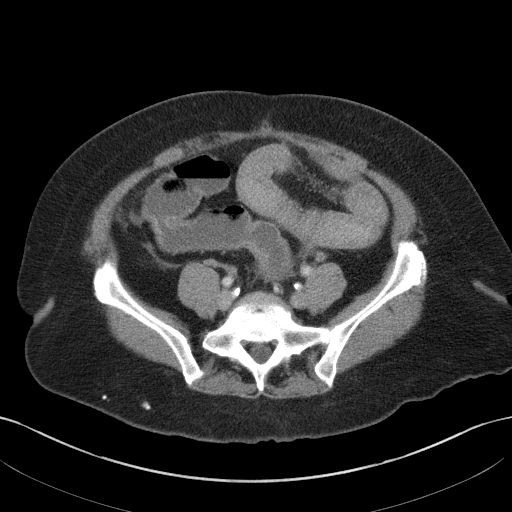
[im 40/94  soft-tissue]
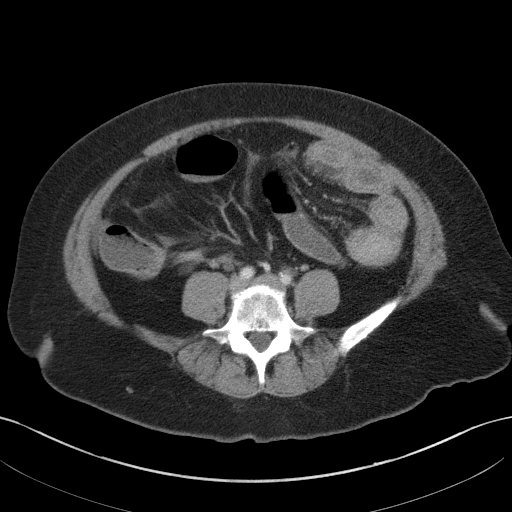
[im 49/94  soft-tissue]
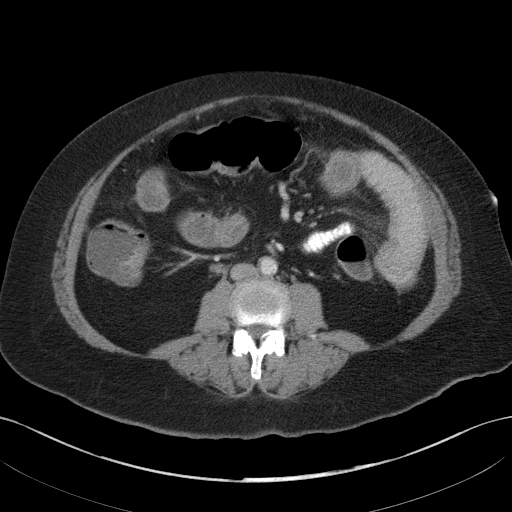
[im 54/94  soft-tissue]
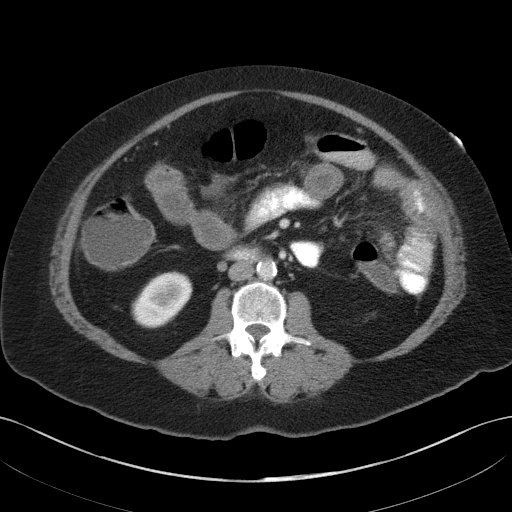
[im 59/94  soft-tissue]
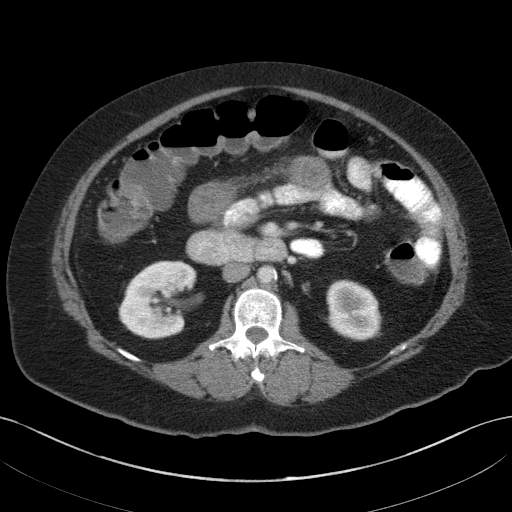
[im 59/94  bone]
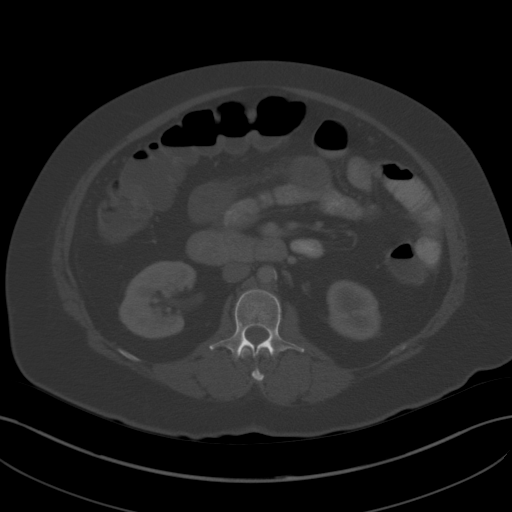
[im 69/94  soft-tissue]
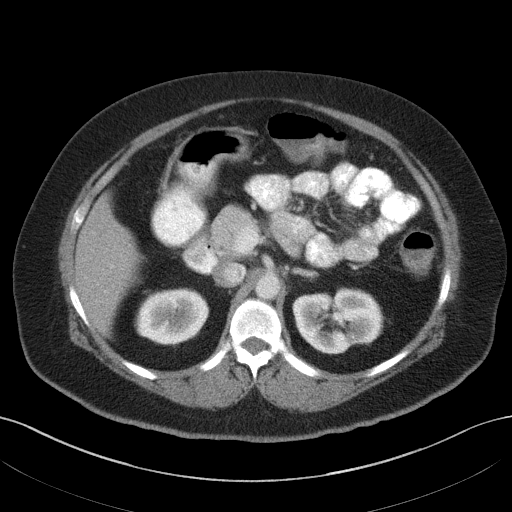
[im 74/94  soft-tissue]
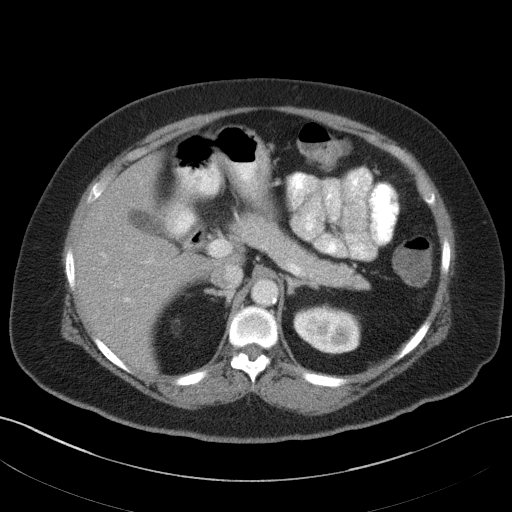
[im 79/94  soft-tissue]
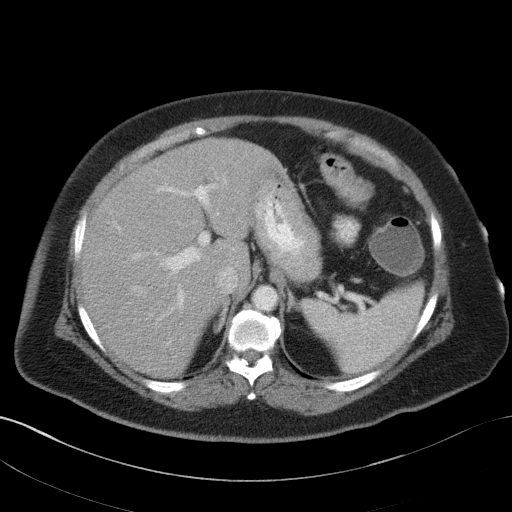
[im 89/94  soft-tissue]
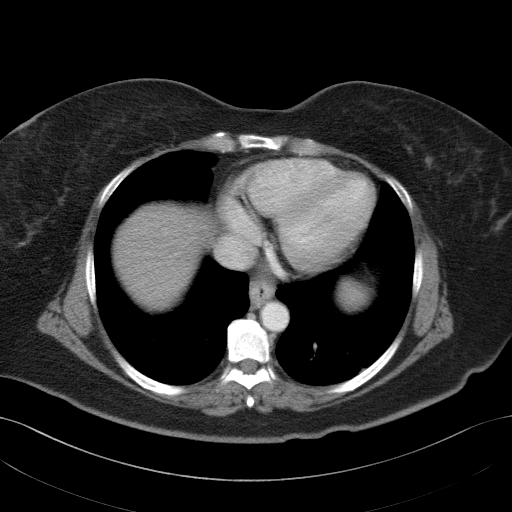

[Series 5: abd/pelvis 3.0 coronal · coronal · 0.81mm/px · 3 of 97 slices shown]
[im 33/97  soft-tissue]
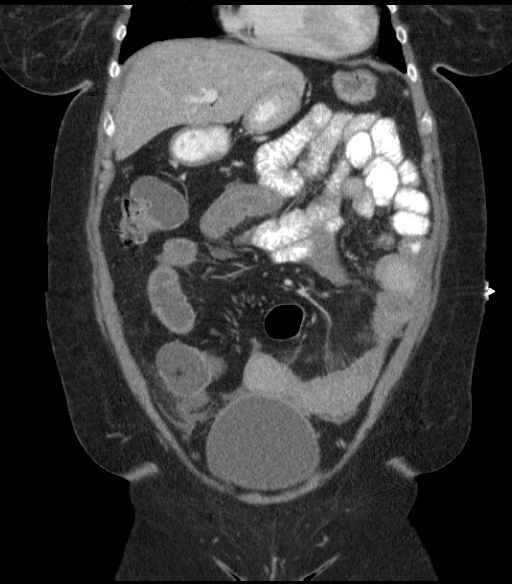
[im 43/97  soft-tissue]
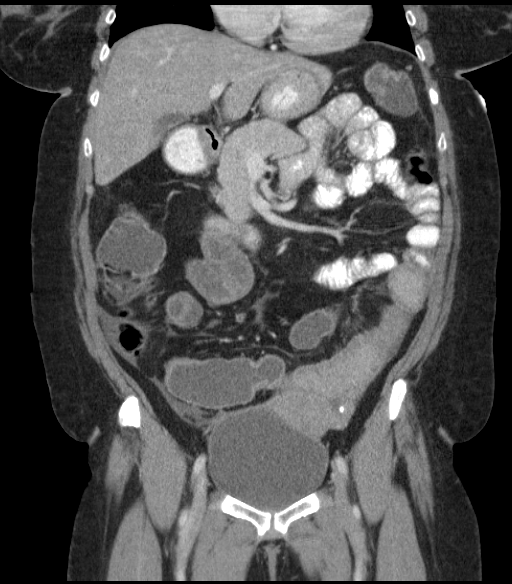
[im 54/97  soft-tissue]
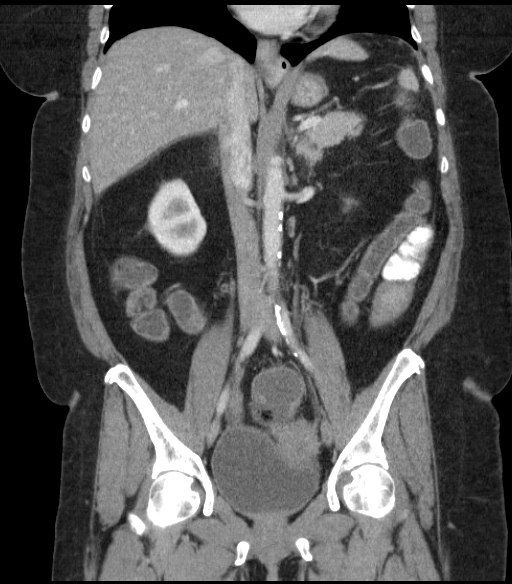

[16 of 46 positions shown; findings below may reference images not displayed]

FINDINGS: Minimal left base atelectasis. Right lung base is clear. Heart is
normal size. No effusions.

Liver, gallbladder, spleen, pancreas, adrenals and right kidney are
unremarkable. Cortical thinning/ scarring noted in the midpole of
the left kidney. No stones or hydronephrosis.

There are abnormal small bowel loops noted in the mid and left
abdomen and pelvis with wall thickening. This is compatible with
infectious or inflammatory enteritis. No evidence of bowel
obstruction. There is a small amount of free fluid in the pelvis and
right paracolic gutter. There are scattered air-fluid levels in the
colon. The rectosigmoid colon appears fluid-filled. Stomach is
decompressed, grossly unremarkable.

Aortic and iliac calcifications without aneurysm. No free air or
adenopathy.

No acute bony abnormality. Degenerative disc disease changes at
L5-S1 with disc space narrowing and vacuum disc.
IMPRESSION: Small bowel wall thickening and surrounding inflammation involving
small bowel loops in the mid and left lower abdomen and pelvis most
compatible with infectious or inflammatory enteritis. Small amount
of associated free fluid in the abdomen and pelvis.
